# Patient Record
Sex: Male | Born: 1970 | Race: White | Hispanic: No | Marital: Married | Smoking: Former smoker
Health system: Southern US, Community
[De-identification: ages and names within clinical notes are randomized; demographics above are authoritative.]

## PROBLEM LIST (undated history)

## (undated) DIAGNOSIS — G4730 Sleep apnea, unspecified: Secondary | ICD-10-CM

## (undated) DIAGNOSIS — G709 Myoneural disorder, unspecified: Secondary | ICD-10-CM

## (undated) DIAGNOSIS — M1990 Unspecified osteoarthritis, unspecified site: Secondary | ICD-10-CM

## (undated) DIAGNOSIS — C801 Malignant (primary) neoplasm, unspecified: Secondary | ICD-10-CM

## (undated) HISTORY — PX: BACK SURGERY: SHX140

## (undated) HISTORY — PX: LUMBAR LAMINECTOMY: SHX95

---

## 2008-04-03 HISTORY — PX: FRACTURE SURGERY: SHX138

## 2008-06-10 ENCOUNTER — Ambulatory Visit (HOSPITAL_COMMUNITY): Admission: RE | Admit: 2008-06-10 | Discharge: 2008-06-10 | Payer: Self-pay | Admitting: Neurosurgery

## 2010-07-14 LAB — CBC
HCT: 46 % (ref 39.0–52.0)
Hemoglobin: 16.4 g/dL (ref 13.0–17.0)
MCHC: 35.7 g/dL (ref 30.0–36.0)
MCV: 92.9 fL (ref 78.0–100.0)
RDW: 12.3 % (ref 11.5–15.5)

## 2010-08-16 NOTE — Op Note (Signed)
NAME:  DUVALL, COMES NO.:  [id number]   MEDICAL RECORD NO.:  [id number]          PATIENT TYPE:  OIB   LOCATION:  3534                         FACILITY:  [facility]   PHYSICIAN:  Donalee Citrin, M.D.        DATE OF BIRTH:  02/09/1971   DATE OF PROCEDURE:  06/10/2008  DATE OF DISCHARGE:  06/10/2008                               OPERATIVE REPORT   PREOPERATIVE DIAGNOSIS:  Lumbar spondylosis with radiculopathy and  herniated nucleus pulposis at L5-S1 and L4-L5.   PROCEDURE:  Lumbar laminectomy and microdiskectomy L4-5 and L5-S1 with  microscopic dissection of the L4-L5 disk space and microscopic  diskectomy as well as microscopic dissection of the right S1 nerve root  and microdiskectomy at L5-S1.   SURGEON:  Donalee Citrin, MD   ASSISTANT:  Tia Alert, MD   ANESTHESIA:  General endotracheal.   HISTORY OF PRESENT ILLNESS:  The patient is a very pleasant 40 year old  gentleman who has had progressively worsening back and right leg pain  radiating to his hip down the back of his leg, top of foot, and big toe,  as well as  outside of his foot and bottom of is foot with numbness and  tingling in the same distribution.  The patient failed all forms of  conservative treatment, epidural steroid injections, physical therapy,  and time.  MRI scan showed a very large disk herniation at L5-S1 as well  as a large one at central at L4-5.  Due to the patient's failure to  conservative treatment, MRI findings, and physical exam, the patient was  recommended laminectomy and microdiskectomy at both levels.  Risks and  benefits of the operation were explained to the patient.  He understood  and agreed to proceed forth.   DESCRIPTION OF PROCEDURE:  The patient was brought to the OR and induced  under general anesthesia, positioned prone on the Wilson frame.  Back  was prepped and draped in sterile fashion.  Preoperative x-ray localized  at the appropriate level.  After infiltration of 10  mL of lidocaine with  epinephrine,  a midline incision was made.  Bovie electrocautery was  used to take down the subperiosteum and dissection was carried down the  lamina at L4-5 and L5-S1 on the right side.  Intraoperative x-ray  confirmed localization at the appropriate level.  So then using a high-  speed drill, the inferior aspect of L4 medial facet complex and superior  aspect of L5 and inferior aspect of L5 and  medial facet complex,  superior aspect of S1 was drilled down.  First working at L4-5, the  undersurface of the lamina at L4 was bitten away as well as medial facet  and superior aspect of L4-5.  Ligament was identified, removed a piece  of fascia exposed and thecal sac.  At this point, some additional  underbiting of the medial facet complex was carried out, then attention  taken to disk space below this in a similar fashion and the undersurface  of the L5 lamina, medial facet complex, superior aspect of S1 was  removed  and piece of fascia exposed and thecal sac.  At this point, the  operating microscope was draped, brought into the field, and under  microscopic illumination, further underbiting of the medial facet  complex was carried out to identify the S1 nerve root.  The S1 nerve  root was then dissected off.  Very large disk herniation still partially  contained with the ligament at L5-S1.  After this was cut, a very large  free-fragment disk came out en bloc as one piece at L5-S1 decompressing  the thecal sac immediately.  Then disk space was further cleaned out  with Epstein curettes and pituitary rongeurs ensuring decompression of  the thecal sac centrally as well as the S1 nerve root on the right.  At  this point, Gelfoam was placed in the disk space.  Attention was taken  to L4-5, further underbiting of the medial facet complex was carried  out.  Disk space was identified, epidural vein was coagulated, several  large fragments removed in the central compartment of  L4-5.  This  decompressed the thecal sac centrally there as well and the L5 nerve  roots widely patent.  Both laminotomy sites were explored with a  coronary dilator and angled hockey stick and noted to be widely patent.  Wound was copiously irrigated.  Meticulous hemostasis was  maintained__________.  Muscle fascia was reapproximated in layers with  Vicryl and the skin was closed with running 4-0 subcuticular.  Benzoin  and Steri-Strips applied.  The patient went to recovery room in stable  condition.  At the end of the case, all sponge and instrument counts  were correct.           ______________________________  Donalee Citrin, M.D.     GC/MEDQ  D:  06/10/2008  T:  06/11/2008  Job:  [id number]

## 2012-06-18 ENCOUNTER — Other Ambulatory Visit: Payer: Self-pay | Admitting: *Deleted

## 2012-06-18 DIAGNOSIS — G4710 Hypersomnia, unspecified: Secondary | ICD-10-CM

## 2012-06-18 DIAGNOSIS — R0683 Snoring: Secondary | ICD-10-CM

## 2012-06-27 ENCOUNTER — Ambulatory Visit (INDEPENDENT_AMBULATORY_CARE_PROVIDER_SITE_OTHER): Payer: BC Managed Care – PPO | Admitting: Neurology

## 2012-06-27 ENCOUNTER — Other Ambulatory Visit: Payer: Self-pay | Admitting: *Deleted

## 2012-06-27 VITALS — BP 162/89 | Ht 69.5 in | Wt 242.0 lb

## 2012-06-27 DIAGNOSIS — R0683 Snoring: Secondary | ICD-10-CM

## 2012-06-27 DIAGNOSIS — E669 Obesity, unspecified: Secondary | ICD-10-CM

## 2012-06-27 DIAGNOSIS — G4733 Obstructive sleep apnea (adult) (pediatric): Secondary | ICD-10-CM

## 2012-06-27 DIAGNOSIS — G4710 Hypersomnia, unspecified: Secondary | ICD-10-CM

## 2012-06-27 DIAGNOSIS — G4730 Sleep apnea, unspecified: Secondary | ICD-10-CM

## 2012-06-27 DIAGNOSIS — R0609 Other forms of dyspnea: Secondary | ICD-10-CM

## 2012-06-27 DIAGNOSIS — G4737 Central sleep apnea in conditions classified elsewhere: Secondary | ICD-10-CM

## 2012-06-27 DIAGNOSIS — R0989 Other specified symptoms and signs involving the circulatory and respiratory systems: Secondary | ICD-10-CM

## 2012-06-27 NOTE — Addendum Note (Signed)
Addended by: Bonita Quin B on: 06/27/2012 04:14 PM   Modules accepted: Orders

## 2012-07-12 ENCOUNTER — Other Ambulatory Visit: Payer: Self-pay | Admitting: *Deleted

## 2012-07-12 DIAGNOSIS — E669 Obesity, unspecified: Secondary | ICD-10-CM

## 2012-07-12 DIAGNOSIS — R0683 Snoring: Secondary | ICD-10-CM

## 2012-07-12 DIAGNOSIS — G4710 Hypersomnia, unspecified: Secondary | ICD-10-CM

## 2012-07-22 ENCOUNTER — Telehealth: Payer: Self-pay | Admitting: Neurology

## 2012-07-22 NOTE — Telephone Encounter (Signed)
Called the patient to try and scheduled titration study based on recommendations from Dr. Porfirio Mylar Dohmeier.  Pt says that due to a very busy schedule, he would have to call back at a later date to set it up...kl

## 2016-04-03 HISTORY — PX: HEMORRHOID SURGERY: SHX153

## 2018-12-27 ENCOUNTER — Other Ambulatory Visit: Payer: Self-pay | Admitting: Neurosurgery

## 2018-12-27 DIAGNOSIS — M5412 Radiculopathy, cervical region: Secondary | ICD-10-CM

## 2019-01-16 ENCOUNTER — Other Ambulatory Visit: Payer: Self-pay

## 2019-01-27 HISTORY — PX: CERVICAL FUSION: SHX112

## 2019-09-30 ENCOUNTER — Other Ambulatory Visit: Payer: Self-pay | Admitting: Neurosurgery

## 2019-10-21 NOTE — Pre-Procedure Instructions (Signed)
Your procedure is scheduled on Monday, July 26 from 07:30 AM- 11:57 AM.  Report to [facility]tr[facility] "A" at 05:30 A.M., and check in at the Admitting office.  Call this number if you have problems the morning of surgery:  [phone]  Call [phone] if you have any questions prior to your surgery date Monday-Friday 8am-4pm.    Remember:  Do not eat or drink after midnight the night before your surgery.    Take these medicines the morning of surgery with A SIP OF WATER: fexofenadine (ALLEGRA) gabapentin (NEURONTIN)   IF NEEDED: acetaminophen (TYLENOL) cyclobenzaprine (FLEXERIL)  olopatadine (PATADAY) eye drops   As of today, STOP taking any Aspirin (unless otherwise instructed by your surgeon) Aleve, Naproxen, Ibuprofen, Motrin, Advil, Goody's, BC's, all herbal medications, fish oil, and all vitamins.      The Morning of Surgery:                Do not wear jewelry.            Do not wear lotions, powders, colognes, or deodorant.            Men may shave face and neck.            Do not bring valuables to the hospital.            [facility] is not responsible for any belongings or valuables.  Do NOT Smoke (Tobacco/Vaping) or drink Alcohol 24 hours prior to your procedure.  If you use a CPAP at night, you may bring all equipment for your overnight stay.   Contacts, glasses, dentures or bridgework may not be worn into surgery.      For patients admitted to the hospital, discharge time will be determined by your treatment team.   Patients discharged the day of surgery will not be allowed to drive home, and someone needs to stay with them for 24 hours.    Special instructions:   [facility]- Preparing For Surgery  Before surgery, you can play an important role. Because skin is not sterile, your skin needs to be as free of germs as possible. You can reduce the number of germs on your skin by washing with CHG (chlorahexidine gluconate) Soap before surgery.  CHG is  an antiseptic cleaner which kills germs and bonds with the skin to continue killing germs even after washing.    Oral Hygiene is also important to reduce your risk of infection.  Remember - BRUSH YOUR TEETH THE MORNING OF SURGERY WITH YOUR REGULAR TOOTHPASTE  Please do not use if you have an allergy to CHG or antibacterial soaps. If your skin becomes reddened/irritated stop using the CHG.  Do not shave (including legs and underarms) for at least 48 hours prior to first CHG shower. It is OK to shave your face.  Please follow these instructions carefully.   1. Shower the NIGHT BEFORE SURGERY and the MORNING OF SURGERY with CHG Soap.   2. If you chose to wash your hair, wash your hair first as usual with your normal shampoo.  3. After you shampoo, rinse your hair and body thoroughly to remove the shampoo.  4. Use CHG as you would any other liquid soap. You can apply CHG directly to the skin and wash gently with a scrungie or a clean washcloth.   5. Apply the CHG Soap to your body ONLY FROM THE NECK DOWN.  Do not use on open wounds or open sores. Avoid contact  with your eyes, ears, mouth and genitals (private parts). Wash Face and genitals (private parts)  with your normal soap.   6. Wash thoroughly, paying special attention to the area where your surgery will be performed.  7. Thoroughly rinse your body with warm water from the neck down.  8. DO NOT shower/wash with your normal soap after using and rinsing off the CHG Soap.  9. Pat yourself dry with a CLEAN TOWEL.  10. Wear CLEAN PAJAMAS to bed the night before surgery  11. Place CLEAN SHEETS on your bed the night of your first shower and DO NOT SLEEP WITH PETS.   Day of Surgery: Wear Clean/Comfortable clothing the morning of surgery Do not apply any deodorants/lotions.   Remember to brush your teeth WITH YOUR REGULAR TOOTHPASTE.   Please read over the following fact sheets that you were given.

## 2019-10-22 ENCOUNTER — Encounter (HOSPITAL_COMMUNITY): Payer: Self-pay

## 2019-10-22 ENCOUNTER — Other Ambulatory Visit: Payer: Self-pay

## 2019-10-22 ENCOUNTER — Encounter (HOSPITAL_COMMUNITY)
Admission: RE | Admit: 2019-10-22 | Discharge: 2019-10-22 | Disposition: A | Discharge status: Home / Self Care | Payer: BC Managed Care – PPO | Source: Ambulatory Visit | Attending: Neurosurgery | Admitting: Neurosurgery

## 2019-10-22 DIAGNOSIS — Z01812 Encounter for preprocedural laboratory examination: Secondary | ICD-10-CM | POA: Insufficient documentation

## 2019-10-22 HISTORY — DX: Malignant (primary) neoplasm, unspecified: C80.1

## 2019-10-22 HISTORY — DX: Sleep apnea, unspecified: G47.30

## 2019-10-22 HISTORY — DX: Unspecified osteoarthritis, unspecified site: M19.90

## 2019-10-22 HISTORY — DX: Myoneural disorder, unspecified: G70.9

## 2019-10-22 LAB — CBC
HCT: 45.1 % (ref 39.0–52.0)
Hemoglobin: 16 g/dL (ref 13.0–17.0)
MCH: 34.2 pg — ABNORMAL HIGH (ref 26.0–34.0)
MCHC: 35.5 g/dL (ref 30.0–36.0)
MCV: 96.4 fL (ref 80.0–100.0)
Platelets: 166 10*3/uL (ref 150–400)
RBC: 4.68 MIL/uL (ref 4.22–5.81)
RDW: 12.3 % (ref 11.5–15.5)
WBC: 4.1 10*3/uL (ref 4.0–10.5)
nRBC: 0 % (ref 0.0–0.2)

## 2019-10-22 LAB — SURGICAL PCR SCREEN
MRSA, PCR: NEGATIVE
Staphylococcus aureus: NEGATIVE

## 2019-10-22 LAB — TYPE AND SCREEN
ABO/RH(D): O POS
Antibody Screen: NEGATIVE

## 2019-10-22 LAB — COMPREHENSIVE METABOLIC PANEL
ALT: 42 U/L (ref 0–44)
AST: 30 U/L (ref 15–41)
Albumin: 4.4 g/dL (ref 3.5–5.0)
Alkaline Phosphatase: 60 U/L (ref 38–126)
Anion gap: 9 (ref 5–15)
BUN: 15 mg/dL (ref 6–20)
CO2: 25 mmol/L (ref 22–32)
Calcium: 9.3 mg/dL (ref 8.9–10.3)
Chloride: 103 mmol/L (ref 98–111)
Creatinine, Ser: 1.09 mg/dL (ref 0.61–1.24)
GFR calc Af Amer: >60 mL/min (ref >60)
GFR calc non Af Amer: >60 mL/min (ref >60)
Glucose, Bld: 118 mg/dL — ABNORMAL HIGH (ref 70–99)
Potassium: 4 mmol/L (ref 3.5–5.1)
Sodium: 137 mmol/L (ref 135–145)
Total Bilirubin: 1.1 mg/dL (ref 0.3–1.2)
Total Protein: 7.9 g/dL (ref 6.5–8.1)

## 2019-10-22 NOTE — Progress Notes (Signed)
PCP - Roni Bread, MD along with Devona Konig, PA-C Cardiologist - Denies  PPM/ICD - Denies  Chest x-ray - 11/18/18 C.E EKG - 11/15/18 C.E Stress Test - Per patient > 10 years ago at Dr. Thompson Caul office. Per patient, results were negative; no need to follow up with cardiologist. ECHO - 11/15/18 C.E Cardiac Cath - Denies  Sleep Study - Yes, positive for OSA CPAP - Yes  Patient denies being diabetic.  Blood Thinner Instructions: N/A Aspirin Instructions: N/A  ERAS Protcol - No PRE-SURGERY Ensure or G2- N/A  COVID TEST- 10/24/19   Anesthesia review: Yes, review ECHO.  Patient denies shortness of breath, fever, cough and chest pain at PAT appointment   All instructions explained to the patient, with a verbal understanding of the material. Patient agrees to go over the instructions while at home for a better understanding. Patient also instructed to self quarantine after being tested for COVID-19. The opportunity to ask questions was provided.

## 2019-10-23 NOTE — Progress Notes (Signed)
Anesthesia Chart Review:  Last seen by PCP Devona Konig, PA-C 10/14/2019, doing well at that visit, discussed upcoming lumbar surgery.  OSA on CPAP.  Preop labs reviewed, unremarkable.  EKG 11/15/2018 (Care Everywhere, tracing requested): Normal sinus rhythm.  Rate 75.  TTE 11/15/18: Interpretation Summary  A complete two-dimensional transthoracic echocardiogram with color flow  Doppler and spectral Doppler was performed. The left ventricle is normal in  size.  There is normal left ventricular wall thickness.  The left ventricular wall motion is normal.  There is mild (1+) mitral regurgitation.  The aortic valve is not well visualized, but is grossly normal.  The left ventricular ejection fraction is normal (55-60%).  The left ventricular diastolic function is normal.     Wynonia Musty [facility] Stay [facility]/Anesthesiology Phone [phone] 10/23/2019 1:02 PM

## 2019-10-23 NOTE — Anesthesia Preprocedure Evaluation (Addendum)
Anesthesia Evaluation  Patient identified by MRN, date of birth, ID band Patient awake    Reviewed: Allergy & Precautions, NPO status , Patient's Chart, lab work & pertinent test results  Airway Mallampati: II  TM Distance: >3 FB     Dental   Pulmonary sleep apnea , former smoker,    breath sounds clear to auscultation       Cardiovascular negative cardio ROS   Rhythm:Regular Rate:Normal     Neuro/Psych  Neuromuscular disease    GI/Hepatic negative GI ROS, Neg liver ROS,   Endo/Other  negative endocrine ROS  Renal/GU negative Renal ROS     Musculoskeletal  (+) Arthritis ,   Abdominal   Peds  Hematology   Anesthesia Other Findings   Reproductive/Obstetrics                           Anesthesia Physical Anesthesia Plan  ASA: II  Anesthesia Plan: General   Post-op Pain Management:    Induction: Intravenous  PONV Risk Score and Plan: 2 and Ondansetron, Dexamethasone and Midazolam  Airway Management Planned: Oral ETT  Additional Equipment:   Intra-op Plan:   Post-operative Plan: Possible Post-op intubation/ventilation  Informed Consent: I have reviewed the patients History and Physical, chart, labs and discussed the procedure including the risks, benefits and alternatives for the proposed anesthesia with the patient or authorized representative who has indicated his/her understanding and acceptance.     Dental advisory given  Plan Discussed with:   Anesthesia Plan Comments: (PAT note by Karoline Caldwell, PA-C: Last seen by PCP Devona Konig, PA-C 10/14/2019, doing well at that visit, discussed upcoming lumbar surgery.  OSA on CPAP.  Preop labs reviewed, unremarkable.  EKG 11/15/2018 (Care Everywhere, tracing requested): Normal sinus rhythm.  Rate 75.  TTE 11/15/18: Interpretation Summary  A complete two-dimensional transthoracic echocardiogram with color flow  Doppler and  spectral Doppler was performed. The left ventricle is normal in  size.  There is normal left ventricular wall thickness.  The left ventricular wall motion is normal.  There is mild (1+) mitral regurgitation.  The aortic valve is not well visualized, but is grossly normal.  The left ventricular ejection fraction is normal (55-60%).  The left ventricular diastolic function is normal.   )      Anesthesia Quick Evaluation

## 2019-10-24 ENCOUNTER — Other Ambulatory Visit (HOSPITAL_COMMUNITY)
Admission: RE | Admit: 2019-10-24 | Discharge: 2019-10-24 | Disposition: A | Discharge status: Home / Self Care | Payer: BC Managed Care – PPO | Source: Ambulatory Visit | Attending: Neurosurgery | Admitting: Neurosurgery

## 2019-10-24 DIAGNOSIS — Z01812 Encounter for preprocedural laboratory examination: Secondary | ICD-10-CM | POA: Insufficient documentation

## 2019-10-24 DIAGNOSIS — Z20822 Contact with and (suspected) exposure to covid-19: Secondary | ICD-10-CM | POA: Insufficient documentation

## 2019-10-24 LAB — SARS CORONAVIRUS 2 (TAT 6-24 HRS): SARS Coronavirus 2: NEGATIVE

## 2019-10-27 ENCOUNTER — Inpatient Hospital Stay (HOSPITAL_COMMUNITY): Payer: BC Managed Care – PPO | Admitting: Anesthesiology

## 2019-10-27 ENCOUNTER — Other Ambulatory Visit: Payer: Self-pay

## 2019-10-27 ENCOUNTER — Encounter (HOSPITAL_COMMUNITY): Payer: Self-pay | Admitting: Neurosurgery

## 2019-10-27 ENCOUNTER — Inpatient Hospital Stay (HOSPITAL_COMMUNITY): Payer: BC Managed Care – PPO | Admitting: Physician Assistant

## 2019-10-27 ENCOUNTER — Inpatient Hospital Stay (HOSPITAL_COMMUNITY)
Admission: RE | Admit: 2019-10-27 | Discharge: 2019-10-28 | DRG: 460 | Disposition: A | Discharge status: Home / Self Care | Payer: BC Managed Care – PPO | Attending: Neurosurgery | Admitting: Neurosurgery

## 2019-10-27 ENCOUNTER — Inpatient Hospital Stay (HOSPITAL_COMMUNITY): Payer: BC Managed Care – PPO

## 2019-10-27 ENCOUNTER — Encounter (HOSPITAL_COMMUNITY)
Admission: RE | Disposition: A | Discharge status: Home / Self Care | Payer: Self-pay | Source: Home / Self Care | Attending: Neurosurgery

## 2019-10-27 DIAGNOSIS — Z87891 Personal history of nicotine dependence: Secondary | ICD-10-CM

## 2019-10-27 DIAGNOSIS — M5136 Other intervertebral disc degeneration, lumbar region: Secondary | ICD-10-CM | POA: Diagnosis present

## 2019-10-27 DIAGNOSIS — M48061 Spinal stenosis, lumbar region without neurogenic claudication: Secondary | ICD-10-CM | POA: Diagnosis present

## 2019-10-27 DIAGNOSIS — M5137 Other intervertebral disc degeneration, lumbosacral region: Secondary | ICD-10-CM | POA: Diagnosis present

## 2019-10-27 DIAGNOSIS — M4807 Spinal stenosis, lumbosacral region: Secondary | ICD-10-CM | POA: Diagnosis present

## 2019-10-27 DIAGNOSIS — Z20822 Contact with and (suspected) exposure to covid-19: Secondary | ICD-10-CM | POA: Diagnosis present

## 2019-10-27 DIAGNOSIS — M47816 Spondylosis without myelopathy or radiculopathy, lumbar region: Secondary | ICD-10-CM | POA: Diagnosis present

## 2019-10-27 DIAGNOSIS — Z79899 Other long term (current) drug therapy: Secondary | ICD-10-CM

## 2019-10-27 DIAGNOSIS — Z8547 Personal history of malignant neoplasm of testis: Secondary | ICD-10-CM | POA: Diagnosis not present

## 2019-10-27 DIAGNOSIS — Z419 Encounter for procedure for purposes other than remedying health state, unspecified: Secondary | ICD-10-CM

## 2019-10-27 LAB — ABO/RH: ABO/RH(D): O POS

## 2019-10-27 SURGERY — POSTERIOR LUMBAR FUSION 2 LEVEL
Anesthesia: General | Site: Back

## 2019-10-27 MED ORDER — ONDANSETRON HCL 4 MG/2ML IJ SOLN
INTRAMUSCULAR | Status: DC | PRN
  Administered 2019-10-27: 4 mg via INTRAVENOUS

## 2019-10-27 MED ORDER — FENTANYL CITRATE (PF) 100 MCG/2ML IJ SOLN
INTRAMUSCULAR | Status: DC | PRN
  Administered 2019-10-27: 100 ug via INTRAVENOUS
  Administered 2019-10-27 (×3): 50 ug via INTRAVENOUS
  Administered 2019-10-27: 100 ug via INTRAVENOUS

## 2019-10-27 MED ORDER — HYDROMORPHONE HCL 1 MG/ML IJ SOLN
0.5000 mg | INTRAMUSCULAR | Status: DC | PRN
  Administered 2019-10-27: 0.5 mg via INTRAVENOUS
  Filled 2019-10-27: qty 0.5

## 2019-10-27 MED ORDER — SODIUM CHLORIDE 0.9% FLUSH: 3.0000 mL | INTRAVENOUS | Status: DC | PRN

## 2019-10-27 MED ORDER — ORAL CARE MOUTH RINSE: 15.0000 mL | Freq: Once | OROMUCOSAL | Status: AC

## 2019-10-27 MED ORDER — ONDANSETRON HCL 4 MG/2ML IJ SOLN: 4.0000 mg | Freq: Four times a day (QID) | INTRAMUSCULAR | Status: DC | PRN

## 2019-10-27 MED ORDER — LIDOCAINE-EPINEPHRINE 1 %-1:100000 IJ SOLN
INTRAMUSCULAR | Status: AC
  Filled 2019-10-27: qty 1

## 2019-10-27 MED ORDER — ROCURONIUM BROMIDE 10 MG/ML (PF) SYRINGE
PREFILLED_SYRINGE | INTRAVENOUS | Status: AC
  Filled 2019-10-27: qty 10

## 2019-10-27 MED ORDER — MENTHOL 3 MG MT LOZG: 1.0000 | LOZENGE | OROMUCOSAL | Status: DC | PRN

## 2019-10-27 MED ORDER — ZOLPIDEM TARTRATE 5 MG PO TABS
5.0000 mg | ORAL_TABLET | Freq: Every day | ORAL | Status: DC
  Administered 2019-10-27: 5 mg via ORAL
  Filled 2019-10-27: qty 1

## 2019-10-27 MED ORDER — CEFAZOLIN SODIUM-DEXTROSE 2-4 GM/100ML-% IV SOLN
2.0000 g | Freq: Three times a day (TID) | INTRAVENOUS | Status: DC
  Administered 2019-10-27 – 2019-10-28 (×2): 2 g via INTRAVENOUS
  Filled 2019-10-27 (×2): qty 100

## 2019-10-27 MED ORDER — SODIUM CHLORIDE 0.9 % IV SOLN: 250.0000 mL | INTRAVENOUS | Status: DC

## 2019-10-27 MED ORDER — LORATADINE 10 MG PO TABS
10.0000 mg | ORAL_TABLET | Freq: Every day | ORAL | Status: DC
  Administered 2019-10-27 – 2019-10-28 (×2): 10 mg via ORAL
  Filled 2019-10-27 (×2): qty 1

## 2019-10-27 MED ORDER — ONDANSETRON HCL 4 MG PO TABS: 4.0000 mg | ORAL_TABLET | Freq: Four times a day (QID) | ORAL | Status: DC | PRN

## 2019-10-27 MED ORDER — ASCORBIC ACID 500 MG PO TABS
1000.0000 mg | ORAL_TABLET | Freq: Every day | ORAL | Status: DC
  Administered 2019-10-27 – 2019-10-28 (×2): 1000 mg via ORAL
  Filled 2019-10-27 (×2): qty 2

## 2019-10-27 MED ORDER — DEXMEDETOMIDINE (PRECEDEX) IN NS 20 MCG/5ML (4 MCG/ML) IV SYRINGE
PREFILLED_SYRINGE | INTRAVENOUS | Status: AC
  Filled 2019-10-27: qty 5

## 2019-10-27 MED ORDER — DEXMEDETOMIDINE HCL 200 MCG/2ML IV SOLN
INTRAVENOUS | Status: DC | PRN
  Administered 2019-10-27: 12 ug via INTRAVENOUS
  Administered 2019-10-27: 8 ug via INTRAVENOUS

## 2019-10-27 MED ORDER — 0.9 % SODIUM CHLORIDE (POUR BTL) OPTIME
TOPICAL | Status: DC | PRN
  Administered 2019-10-27: 1000 mL

## 2019-10-27 MED ORDER — BUPIVACAINE LIPOSOME 1.3 % IJ SUSP
20.0000 mL | Freq: Once | INTRAMUSCULAR | Status: DC
  Filled 2019-10-27: qty 20

## 2019-10-27 MED ORDER — PHENOL 1.4 % MT LIQD: 1.0000 | OROMUCOSAL | Status: DC | PRN

## 2019-10-27 MED ORDER — CYCLOBENZAPRINE HCL 10 MG PO TABS
10.0000 mg | ORAL_TABLET | Freq: Three times a day (TID) | ORAL | Status: DC | PRN
  Administered 2019-10-27 (×2): 10 mg via ORAL
  Filled 2019-10-27 (×2): qty 1

## 2019-10-27 MED ORDER — LIDOCAINE 2% (20 MG/ML) 5 ML SYRINGE
INTRAMUSCULAR | Status: DC | PRN
  Administered 2019-10-27: 100 mg via INTRAVENOUS

## 2019-10-27 MED ORDER — ALUM & MAG HYDROXIDE-SIMETH 200-200-20 MG/5ML PO SUSP: 30.0000 mL | Freq: Four times a day (QID) | ORAL | Status: DC | PRN

## 2019-10-27 MED ORDER — ACETAMINOPHEN 325 MG PO TABS
650.0000 mg | ORAL_TABLET | ORAL | Status: DC | PRN
  Administered 2019-10-27 – 2019-10-28 (×2): 650 mg via ORAL
  Filled 2019-10-27 (×2): qty 2

## 2019-10-27 MED ORDER — THROMBIN 20000 UNITS EX SOLR
CUTANEOUS | Status: DC | PRN
  Administered 2019-10-27: 20 mL via TOPICAL

## 2019-10-27 MED ORDER — PROPOFOL 10 MG/ML IV BOLUS
INTRAVENOUS | Status: DC | PRN
  Administered 2019-10-27: 50 mg via INTRAVENOUS
  Administered 2019-10-27: 200 mg via INTRAVENOUS

## 2019-10-27 MED ORDER — ROCURONIUM BROMIDE 10 MG/ML (PF) SYRINGE
PREFILLED_SYRINGE | INTRAVENOUS | Status: DC | PRN
  Administered 2019-10-27: 30 mg via INTRAVENOUS
  Administered 2019-10-27: 40 mg via INTRAVENOUS
  Administered 2019-10-27: 60 mg via INTRAVENOUS

## 2019-10-27 MED ORDER — LORATADINE 10 MG PO TABS: 10.0000 mg | ORAL_TABLET | Freq: Every day | ORAL | Status: DC

## 2019-10-27 MED ORDER — LACTATED RINGERS IV SOLN: INTRAVENOUS | Status: DC

## 2019-10-27 MED ORDER — DEXAMETHASONE SODIUM PHOSPHATE 4 MG/ML IJ SOLN
INTRAMUSCULAR | Status: DC | PRN
  Administered 2019-10-27: 10 mg via INTRAVENOUS

## 2019-10-27 MED ORDER — ONDANSETRON HCL 4 MG/2ML IJ SOLN
INTRAMUSCULAR | Status: AC
  Filled 2019-10-27: qty 2

## 2019-10-27 MED ORDER — GABAPENTIN 600 MG PO TABS
600.0000 mg | ORAL_TABLET | Freq: Two times a day (BID) | ORAL | Status: DC
  Administered 2019-10-27 – 2019-10-28 (×2): 600 mg via ORAL
  Filled 2019-10-27 (×2): qty 1

## 2019-10-27 MED ORDER — FENTANYL CITRATE (PF) 100 MCG/2ML IJ SOLN
INTRAMUSCULAR | Status: AC
  Filled 2019-10-27: qty 2

## 2019-10-27 MED ORDER — SUGAMMADEX SODIUM 200 MG/2ML IV SOLN
INTRAVENOUS | Status: DC | PRN
  Administered 2019-10-27: 200 mg via INTRAVENOUS

## 2019-10-27 MED ORDER — ACETAMINOPHEN 650 MG RE SUPP: 650.0000 mg | RECTAL | Status: DC | PRN

## 2019-10-27 MED ORDER — MIDAZOLAM HCL 5 MG/5ML IJ SOLN
INTRAMUSCULAR | Status: DC | PRN
  Administered 2019-10-27: 2 mg via INTRAVENOUS

## 2019-10-27 MED ORDER — CEFAZOLIN SODIUM-DEXTROSE 2-4 GM/100ML-% IV SOLN
2.0000 g | INTRAVENOUS | Status: AC
  Administered 2019-10-27 (×2): 2 g via INTRAVENOUS
  Filled 2019-10-27: qty 100

## 2019-10-27 MED ORDER — FENTANYL CITRATE (PF) 100 MCG/2ML IJ SOLN
25.0000 ug | INTRAMUSCULAR | Status: DC | PRN
  Administered 2019-10-27 (×3): 50 ug via INTRAVENOUS

## 2019-10-27 MED ORDER — LIDOCAINE-EPINEPHRINE 1 %-1:100000 IJ SOLN
INTRAMUSCULAR | Status: DC | PRN
  Administered 2019-10-27: 10 mL

## 2019-10-27 MED ORDER — VITAMIN D (ERGOCALCIFEROL) 1.25 MG (50000 UNIT) PO CAPS: 50000.0000 [IU] | ORAL_CAPSULE | ORAL | Status: DC

## 2019-10-27 MED ORDER — CEFAZOLIN SODIUM 1 G IJ SOLR
INTRAMUSCULAR | Status: AC
  Filled 2019-10-27: qty 20

## 2019-10-27 MED ORDER — CYANOCOBALAMIN 500 MCG PO TABS
500.0000 ug | ORAL_TABLET | Freq: Every day | ORAL | Status: DC
  Administered 2019-10-28: 500 ug via ORAL
  Filled 2019-10-27: qty 1

## 2019-10-27 MED ORDER — MIDAZOLAM HCL 2 MG/2ML IJ SOLN
INTRAMUSCULAR | Status: AC
  Filled 2019-10-27: qty 2

## 2019-10-27 MED ORDER — ACETAMINOPHEN 500 MG PO TABS: 1000.0000 mg | ORAL_TABLET | Freq: Four times a day (QID) | ORAL | Status: DC | PRN

## 2019-10-27 MED ORDER — THROMBIN 20000 UNITS EX SOLR
CUTANEOUS | Status: AC
  Filled 2019-10-27: qty 20000

## 2019-10-27 MED ORDER — OLOPATADINE HCL 0.1 % OP SOLN
1.0000 [drp] | Freq: Two times a day (BID) | OPHTHALMIC | Status: DC | PRN
  Filled 2019-10-27: qty 5

## 2019-10-27 MED ORDER — LIDOCAINE 2% (20 MG/ML) 5 ML SYRINGE
INTRAMUSCULAR | Status: AC
  Filled 2019-10-27: qty 5

## 2019-10-27 MED ORDER — SODIUM CHLORIDE 0.9% FLUSH: 3.0000 mL | Freq: Two times a day (BID) | INTRAVENOUS | Status: DC

## 2019-10-27 MED ORDER — PANTOPRAZOLE SODIUM 40 MG IV SOLR
40.0000 mg | Freq: Every day | INTRAVENOUS | Status: DC
  Filled 2019-10-27: qty 40

## 2019-10-27 MED ORDER — DEXAMETHASONE SODIUM PHOSPHATE 10 MG/ML IJ SOLN
INTRAMUSCULAR | Status: AC
  Filled 2019-10-27: qty 1

## 2019-10-27 MED ORDER — CYCLOBENZAPRINE HCL 10 MG PO TABS
10.0000 mg | ORAL_TABLET | Freq: Three times a day (TID) | ORAL | Status: DC | PRN
Start: 2019-10-27 — End: 2019-10-27

## 2019-10-27 MED ORDER — SODIUM CHLORIDE 0.9 % IV SOLN
INTRAVENOUS | Status: DC | PRN
  Administered 2019-10-27: 500 mL

## 2019-10-27 MED ORDER — FENTANYL CITRATE (PF) 250 MCG/5ML IJ SOLN
INTRAMUSCULAR | Status: AC
  Filled 2019-10-27: qty 5

## 2019-10-27 MED ORDER — DEXAMETHASONE SODIUM PHOSPHATE 10 MG/ML IJ SOLN
10.0000 mg | Freq: Once | INTRAMUSCULAR | Status: DC
  Filled 2019-10-27: qty 1

## 2019-10-27 MED ORDER — DIPHENHYDRAMINE HCL 25 MG PO CAPS: 50.0000 mg | ORAL_CAPSULE | Freq: Every evening | ORAL | Status: DC | PRN

## 2019-10-27 MED ORDER — BUPIVACAINE LIPOSOME 1.3 % IJ SUSP
INTRAMUSCULAR | Status: DC | PRN
  Administered 2019-10-27: 20 mL

## 2019-10-27 MED ORDER — PROPOFOL 10 MG/ML IV BOLUS
INTRAVENOUS | Status: AC
  Filled 2019-10-27: qty 40

## 2019-10-27 MED ORDER — CHLORHEXIDINE GLUCONATE CLOTH 2 % EX PADS: 6.0000 | MEDICATED_PAD | Freq: Once | CUTANEOUS | Status: DC

## 2019-10-27 MED ORDER — CHLORHEXIDINE GLUCONATE 0.12 % MT SOLN
15.0000 mL | Freq: Once | OROMUCOSAL | Status: AC
  Administered 2019-10-27: 15 mL via OROMUCOSAL
  Filled 2019-10-27: qty 15

## 2019-10-27 MED ORDER — PANTOPRAZOLE SODIUM 40 MG PO TBEC
40.0000 mg | DELAYED_RELEASE_TABLET | Freq: Every day | ORAL | Status: DC
  Administered 2019-10-27 – 2019-10-28 (×2): 40 mg via ORAL
  Filled 2019-10-27: qty 1

## 2019-10-27 MED ORDER — CIPROFLOXACIN HCL 500 MG PO TABS
500.0000 mg | ORAL_TABLET | Freq: Two times a day (BID) | ORAL | Status: DC
  Administered 2019-10-28: 500 mg via ORAL
  Filled 2019-10-27 (×2): qty 1

## 2019-10-27 MED ORDER — OXYCODONE HCL 5 MG PO TABS
10.0000 mg | ORAL_TABLET | ORAL | Status: DC | PRN
  Administered 2019-10-27 – 2019-10-28 (×5): 10 mg via ORAL
  Filled 2019-10-27 (×5): qty 2

## 2019-10-27 SURGICAL SUPPLY — 75 items
BAG DECANTER FOR FLEXI CONT (MISCELLANEOUS) ×2 IMPLANT
BENZOIN TINCTURE PRP APPL 2/3 (GAUZE/BANDAGES/DRESSINGS) ×2 IMPLANT
BLADE CLIPPER SURG (BLADE) IMPLANT
BLADE SURG 11 STRL SS (BLADE) ×2 IMPLANT
BONE VIVIGEN FORMABLE 5.4CC (Bone Implant) ×2 IMPLANT
BUR CUTTER 7.0 ROUND (BURR) ×2 IMPLANT
BUR MATCHSTICK NEURO 3.0 LAGG (BURR) ×2 IMPLANT
CANISTER SUCT 3000ML PPV (MISCELLANEOUS) ×2 IMPLANT
CAP LOCKING (Cap) ×6 IMPLANT
CAP LOCKING 5.5 CREO (Cap) ×6 IMPLANT
CARTRIDGE OIL MAESTRO DRILL (MISCELLANEOUS) ×1 IMPLANT
CNTNR URN SCR LID CUP LEK RST (MISCELLANEOUS) ×1 IMPLANT
CONT SPEC 4OZ STRL OR WHT (MISCELLANEOUS) ×1
COVER BACK TABLE 60X90IN (DRAPES) ×2 IMPLANT
COVER WAND RF STERILE (DRAPES) ×2 IMPLANT
DECANTER SPIKE VIAL GLASS SM (MISCELLANEOUS) ×2 IMPLANT
DERMABOND ADVANCED (GAUZE/BANDAGES/DRESSINGS) ×1
DERMABOND ADVANCED .7 DNX12 (GAUZE/BANDAGES/DRESSINGS) ×1 IMPLANT
DIFFUSER DRILL AIR PNEUMATIC (MISCELLANEOUS) ×2 IMPLANT
DRAPE C-ARM 42X72 X-RAY (DRAPES) ×2 IMPLANT
DRAPE C-ARMOR (DRAPES) IMPLANT
DRAPE HALF SHEET 40X57 (DRAPES) IMPLANT
DRAPE LAPAROTOMY 100X72X124 (DRAPES) ×2 IMPLANT
DRAPE SURG 17X23 STRL (DRAPES) ×2 IMPLANT
DRSG OPSITE 4X5.5 SM (GAUZE/BANDAGES/DRESSINGS) ×2 IMPLANT
DRSG OPSITE POSTOP 4X6 (GAUZE/BANDAGES/DRESSINGS) ×2 IMPLANT
DRSG OPSITE POSTOP 4X8 (GAUZE/BANDAGES/DRESSINGS) ×2 IMPLANT
DURAPREP 26ML APPLICATOR (WOUND CARE) ×2 IMPLANT
ELECT REM PT RETURN 9FT ADLT (ELECTROSURGICAL) ×2
ELECTRODE REM PT RTRN 9FT ADLT (ELECTROSURGICAL) ×1 IMPLANT
EVACUATOR 1/8 PVC DRAIN (DRAIN) ×2 IMPLANT
EVACUATOR 3/16  PVC DRAIN (DRAIN) ×1
EVACUATOR 3/16 PVC DRAIN (DRAIN) ×1 IMPLANT
GAUZE 4X4 16PLY RFD (DISPOSABLE) IMPLANT
GAUZE SPONGE 4X4 12PLY STRL (GAUZE/BANDAGES/DRESSINGS) ×2 IMPLANT
GLOVE BIO SURGEON STRL SZ7 (GLOVE) IMPLANT
GLOVE BIO SURGEON STRL SZ8 (GLOVE) ×4 IMPLANT
GLOVE BIOGEL PI IND STRL 7.0 (GLOVE) IMPLANT
GLOVE BIOGEL PI INDICATOR 7.0 (GLOVE)
GLOVE EXAM NITRILE XL STR (GLOVE) IMPLANT
GLOVE INDICATOR 8.5 STRL (GLOVE) ×4 IMPLANT
GOWN STRL REUS W/ TWL LRG LVL3 (GOWN DISPOSABLE) IMPLANT
GOWN STRL REUS W/ TWL XL LVL3 (GOWN DISPOSABLE) ×2 IMPLANT
GOWN STRL REUS W/TWL 2XL LVL3 (GOWN DISPOSABLE) IMPLANT
GOWN STRL REUS W/TWL LRG LVL3 (GOWN DISPOSABLE)
GOWN STRL REUS W/TWL XL LVL3 (GOWN DISPOSABLE) ×2
HEMOSTAT POWDER KIT SURGIFOAM (HEMOSTASIS) IMPLANT
KIT BASIN OR (CUSTOM PROCEDURE TRAY) ×2 IMPLANT
KIT POSITION SURG JACKSON T1 (MISCELLANEOUS) ×2 IMPLANT
KIT TURNOVER KIT B (KITS) ×2 IMPLANT
MILL MEDIUM DISP (BLADE) ×2 IMPLANT
NEEDLE HYPO 21X1.5 SAFETY (NEEDLE) ×2 IMPLANT
NEEDLE HYPO 25X1 1.5 SAFETY (NEEDLE) ×2 IMPLANT
NS IRRIG 1000ML POUR BTL (IV SOLUTION) ×2 IMPLANT
OIL CARTRIDGE MAESTRO DRILL (MISCELLANEOUS) ×2
PACK LAMINECTOMY NEURO (CUSTOM PROCEDURE TRAY) ×2 IMPLANT
PAD ARMBOARD 7.5X6 YLW CONV (MISCELLANEOUS) ×6 IMPLANT
ROD CREO 60MM (Rod) ×4 IMPLANT
SCREW 6.5X5.5 30MM (Screw) ×4 IMPLANT
SHAFT CREO 30MM (Neuro Prosthesis/Implant) ×8 IMPLANT
SPACER SUSTAIN RT 12 8D 9X26 (Spacer) ×4 IMPLANT
SPACER TITANIUM 8X26 11 13 (Spacer) ×4 IMPLANT
SPONGE LAP 4X18 RFD (DISPOSABLE) IMPLANT
SPONGE SURGIFOAM ABS GEL 100 (HEMOSTASIS) ×2 IMPLANT
STRIP CLOSURE SKIN 1/2X4 (GAUZE/BANDAGES/DRESSINGS) ×4 IMPLANT
SUT VIC AB 0 CT1 18XCR BRD8 (SUTURE) ×1 IMPLANT
SUT VIC AB 0 CT1 8-18 (SUTURE) ×1
SUT VIC AB 2-0 CT1 18 (SUTURE) ×2 IMPLANT
SUT VIC AB 4-0 PS2 27 (SUTURE) ×2 IMPLANT
SYR 20ML LL LF (SYRINGE) ×2 IMPLANT
TOWEL GREEN STERILE (TOWEL DISPOSABLE) ×2 IMPLANT
TOWEL GREEN STERILE FF (TOWEL DISPOSABLE) ×2 IMPLANT
TRAY FOLEY MTR SLVR 16FR STAT (SET/KITS/TRAYS/PACK) ×2 IMPLANT
TULIP CREP AMP 5.5MM (Orthopedic Implant) ×12 IMPLANT
WATER STERILE IRR 1000ML POUR (IV SOLUTION) ×2 IMPLANT

## 2019-10-27 NOTE — Anesthesia Postprocedure Evaluation (Signed)
Anesthesia Post Note  Patient: Chad Cook  Procedure(s) Performed: Posterior Lumbar Interbody Fusion - Lumbar Four- Lumbar Five - Lumbar Five-Sacral One - Interbody Fusion (N/A Back)     Patient location during evaluation: PACU Anesthesia Type: General Level of consciousness: awake Pain management: pain level controlled Vital Signs Assessment: post-procedure vital signs reviewed and stable Respiratory status: spontaneous breathing Cardiovascular status: stable Postop Assessment: no apparent nausea or vomiting Anesthetic complications: no   No complications documented.  Last Vitals:  Vitals:   10/27/19 1339 10/27/19 1601  BP: (!) 144/87 (!) 157/75  Pulse: 91 84  Resp: 20 19  Temp: 37.2 C 36.9 C  SpO2: 100% 98%    Last Pain:  Vitals:   10/27/19 1601  TempSrc: Oral  PainSc:                  Rubyann Lingle

## 2019-10-27 NOTE — Transfer of Care (Signed)
Immediate Anesthesia Transfer of Care Note  Patient: Chad Cook  Procedure(s) Performed: Posterior Lumbar Interbody Fusion - Lumbar Four- Lumbar Five - Lumbar Five-Sacral One - Interbody Fusion (N/A Back)  Patient Location: PACU  Anesthesia Type:General  Level of Consciousness: awake  Airway & Oxygen Therapy: Patient Spontanous Breathing and Patient connected to face mask oxygen  Post-op Assessment: Report given to RN and Post -op Vital signs reviewed and stable  Post vital signs: Reviewed and stable  Last Vitals:  Vitals Value Taken Time  BP 146/81 10/27/19 1215  Temp    Pulse 86 10/27/19 1216  Resp 13 10/27/19 1216  SpO2 99 % 10/27/19 1216  Vitals shown include unvalidated device data.  Last Pain:  Vitals:   10/27/19 0632  TempSrc:   PainSc: 6       Patients Stated Pain Goal: 3 (44/58/48 3507)  Complications: No complications documented.

## 2019-10-27 NOTE — Op Note (Signed)
Preoperative diagnosis: Lumbar spinal stenosis degenerative disc disease severe foraminal stenosis L4-5 L5-S1 with possible recurrent disc radiation  Postoperative diagnosis: Same  Procedure: #1 redo decompressive lumbar laminectomies L4-5 and L5-S1 in excess and requiring more work than would be needed with a standard interbody fusion with complete medial facetectomies and radical foraminotomies of the L4-L5 and S1 nerve roots  2.  Posterior lumbar interbody fusion L4-5 L5-S1 utilizing the globus insert and rotate titanium cages packed with locally harvested autograft mixed with vivigen  3.  Cortical screw fixation L4-S1 utilizing the globus Creo modular cortical screw set  Surgeon: Dominica Severin Caylor Tallarico  Assistant: Nash Shearer  Anesthesia: General  EBL: Minimal  HPI: 49 year old gentleman has had previous laminectomies and discectomies at L4-5 and L5-S1 the right patient did initially very well with less of weeks months of progressive worsening back pain and right leg pain work-up revealed progressive degenerative collapse scar tissue spondylosis and possible recurrent disc radiation at L4-5.  Due to patient's progression of clinical syndrome imaging findings and failed conservative treatment I recommended redo decompression and stabilization procedure at L4-5 and L5-S1.  I extensively went over the risks and benefits of that operation with him as well as perioperative course expectations of outcome and alternatives of surgery and he understood and agreed to proceed forward.  Operative procedure: Patient brought into the OR was due to general anesthesia position prone on the Dyer table his back was prepped and draped in routine sterile fashion his old incision was extended slightly cephalad caudally and after infiltration of 10 cc lidocaine with epi and then the scar tissue was dissected free and subperiosteal dissection was carried lamina of L4-L5 and S1 bilaterally.  I also exposed the inferior  aspect lamina of L3 to identify the L4 pedicle entry point after intraoperative x-ray confirmed identification appropriate level I then remove the spinous process at L4 and L5 performed a complete central decompression and first working on the left side performed complete medial medial facetectomies and radical foraminotomies the L4 of L5 and S1 nerve root on the left extensively under bit the superior to collating facet to gain access lateral margin of disc base.  Then working through the scar tissue freed this up confirmed complete facetectomies on the right side marching out foraminotomies of the L4-L5 and S1 nerve roots were carried out there was extensive minor compression of the right L5 nerve root primarily due to severe facet arthropathy but also superior spondylitic ridge on the disc base and disc material.  All after all foraminotomies were performed and I gained access lateral margin disc base disc base was cleaned out bilaterally utilizing sequential distraction I inserted 4 cages sequentially with extensive mount autograft between the cages this significantly opened up the disc base and help reduce the compression opening of the foramina.  Then under fluoroscopy all 6 cortical screws were placed all screws excellent purchase postop fluoroscopy confirmed good position of all the implants.  Wounds and copiously irrigated meticulous hemostasis was maintained Gelfoam was overlaid top of the dura the anchoring knots were then anchored in place the L4 screw was compressed against L5 and the L5 compressed against S1.  After all the rods had been anchored in place and exposed draped in the fascia medium Hemovac drain was placed and the wound was closed in layers with interrupted Vicryl in a running 4 subcuticular Dermabond benzoin Steri-Strips and a sterile dressing was applied patient recovery in stable condition.  At the end the case all needle  count sponge counts were correct.

## 2019-10-27 NOTE — Anesthesia Procedure Notes (Signed)
Procedure Name: Intubation Date/Time: 10/27/2019 7:37 AM Performed by: Lieutenant Diego, CRNA Pre-anesthesia Checklist: Patient identified, Emergency Drugs available, Suction available and Patient being monitored Patient Re-evaluated:Patient Re-evaluated prior to induction Oxygen Delivery Method: Circle system utilized Preoxygenation: Pre-oxygenation with 100% oxygen Induction Type: IV induction Ventilation: Mask ventilation without difficulty Laryngoscope Size: Miller and 2 Grade View: Grade I Tube type: Oral Tube size: 7.5 mm Number of attempts: 1 Airway Equipment and Method: Stylet Placement Confirmation: ETT inserted through vocal cords under direct vision,  positive ETCO2 and breath sounds checked- equal and bilateral Secured at: 24 cm Tube secured with: Tape Dental Injury: Teeth and Oropharynx as per pre-operative assessment

## 2019-10-27 NOTE — H&P (Signed)
Chad Cook is an 49 y.o. male.   Chief Complaint: Back and right greater than left leg pain HPI: 49 year old gentleman with longstanding lumbar spondylosis and stenosis back and bilateral leg pain.  Patient had previous laminotomies at L4-5 and L5-S1 the right presents with recurrent back recurrent right greater than left leg pain with progressive mechanical back pain.  Work-up has revealed progressive degenerative collapse Modic changes and spinal stenosis due to patient progression of clinical syndrome imaging findings and failed conservative treatment of recommended decompression stabilization procedure at L4-5 and L5-S1 I extensively gone over the risks and benefits of the operation with him as well as perioperative course expectations of outcome and alternatives to surgery and he understands and agrees to proceed forward.  Past Medical History:  Diagnosis Date  . Arthritis   . Cancer [facility])    testicular cancer  . Neuromuscular disorder (HCC)    neuropathy  . Sleep apnea    cpap at night    Past Surgical History:  Procedure Laterality Date  . BACK SURGERY    . CERVICAL FUSION  01/27/2019   C5-7- Dr. Saintclair Halsted  . FRACTURE SURGERY Right 2010   Leg  . HEMORRHOID SURGERY  2018  . LUMBAR LAMINECTOMY     Laminectomy and Discectomy x2- With Dr. Saintclair Halsted    History reviewed. No pertinent family history. Social History:  reports that he quit smoking about 30 years ago. He has never used smokeless tobacco. He reports current alcohol use. He reports current drug use. Drug: Marijuana.  Allergies:  Allergies  Allergen Reactions  . Codeine Swelling    Happened as a child pt tolerates oxycodone and hydrocodone    Medications Prior to Admission  Medication Sig Dispense Refill  . acetaminophen (TYLENOL) 500 MG tablet Take 1,000 mg by mouth every 6 (six) hours as needed for moderate pain or headache.    . Ascorbic Acid (VITAMIN C) 1000 MG tablet Take 1,000 mg by mouth daily.    . ciprofloxacin  (CIPRO) 500 MG tablet Take 500 mg by mouth 2 (two) times daily.    . cyclobenzaprine (FLEXERIL) 10 MG tablet Take 10 mg by mouth 3 (three) times daily as needed for muscle spasms.    . diphenhydrAMINE (BENADRYL) 25 MG tablet Take 50 mg by mouth at bedtime as needed for sleep.    . fexofenadine (ALLEGRA) 180 MG tablet Take 180 mg by mouth See admin instructions. Take 180 mg daily for 1 week then off one week, alternating with allegra d    . fexofenadine-pseudoephedrine (ALLEGRA-D 24) 180-240 MG 24 hr tablet Take 1 tablet by mouth See admin instructions. Take 1 tablet daily for 1 week then off one week, alternating with plain allegra    . gabapentin (NEURONTIN) 600 MG tablet Take 600 mg by mouth 2 (two) times daily.    . vitamin B-12 (CYANOCOBALAMIN) 500 MCG tablet Take 500 mcg by mouth daily.    . Vitamin D, Ergocalciferol, (DRISDOL) 1.25 MG (50000 UNIT) CAPS capsule Take 50,000 Units by mouth every Monday.    . zolpidem (AMBIEN) 10 MG tablet Take 5 mg by mouth at bedtime.     Marland Kitchen olopatadine (PATADAY) 0.1 % ophthalmic solution Place 1 drop into both eyes 2 (two) times daily as needed for allergies.      No results found for this or any previous visit (from the past 48 hour(s)). No results found.  Review of Systems  Neurological: Positive for weakness and numbness.    Blood  pressure (!) 154/77, pulse 82, temperature 98.5 F (36.9 C), temperature source Oral, resp. rate 18, height 5\' 9"  (1.753 m), weight (!) 113.4 kg, SpO2 97 %. Physical Exam HENT:     Head: Normocephalic.     Nose: Nose normal.  Eyes:     Pupils: Pupils are equal, round, and reactive to light.  Cardiovascular:     Rate and Rhythm: Normal rate.     Pulses: Normal pulses.  Pulmonary:     Effort: Pulmonary effort is normal.  Abdominal:     General: Abdomen is flat. Bowel sounds are normal.  Musculoskeletal:        General: Normal range of motion.     Cervical back: Normal range of motion.  Skin:    General: Skin is  warm.  Neurological:     Mental Status: He is alert.     Comments: Patient is awake and alert strength is 5-5 iliopsoas, quads, hamstrings, gastrocs into tibialis and EHL on the left is 5 out of 5 on the right he has dorsiflexion weakness and EHL weakness partially because of old ankle surgery and a fused ankle.      Assessment/Plan 49 year old presents for decompression stabilization procedure L4-5 L5-S1  Elaina Hoops, MD 10/27/2019, 7:14 AM

## 2019-10-28 MED ORDER — OXYCODONE HCL 10 MG PO TABS: 10.0000 mg | ORAL_TABLET | ORAL | 0 refills | Status: AC | PRN

## 2019-10-28 NOTE — Discharge Summary (Signed)
Physician Discharge Summary  Patient ID: Chad Cook MRN: [id number] DOB/AGE: 09-29-1970 49 y.o. Estimated body mass index is 36.92 kg/m as calculated from the following:   Height as of this encounter: 5\' 9"  (1.753 m).   Weight as of this encounter: 113.4 kg.   Admit date: 10/27/2019 Discharge date: 10/28/2019  Admission Diagnoses: Lumbar degenerative disc disease spinal stenosis L4-5 L5-S1  Discharge Diagnoses: Same Active Problems:   Spinal stenosis at L4-L5 level   Discharged Condition: fair  Hospital Course: Patient was admitted to hospital underwent decompression stabilization procedure L4-5 L5-S1 was ambulating and voiding tolerating her diet working with physical therapy and stable for discharge home postop day 1.  Patient be discharged scheduled follow-up in 1 to 2 weeks.  Consults: Significant Diagnostic Studies: Treatments: Strength out of 5 wound clean dry and intact Discharge Exam: Blood pressure (!) 147/69, pulse 73, temperature 98.2 F (36.8 C), temperature source Oral, resp. rate 20, height 5\' 9"  (1.753 m), weight (!) 113.4 kg, SpO2 98 %. Strength 5 of 5 wound clean dry and intact  Disposition: Home   Allergies as of 10/28/2019      Reactions   Codeine Swelling   Happened as a child pt tolerates oxycodone and hydrocodone      Medication List    TAKE these medications   acetaminophen 500 MG tablet Commonly known as: TYLENOL Take 1,000 mg by mouth every 6 (six) hours as needed for moderate pain or headache.   ciprofloxacin 500 MG tablet Commonly known as: CIPRO Take 500 mg by mouth 2 (two) times daily.   cyclobenzaprine 10 MG tablet Commonly known as: FLEXERIL Take 10 mg by mouth 3 (three) times daily as needed for muscle spasms.   diphenhydrAMINE 25 MG tablet Commonly known as: BENADRYL Take 50 mg by mouth at bedtime as needed for sleep.   fexofenadine 180 MG tablet Commonly known as: ALLEGRA Take 180 mg by mouth See admin instructions. Take  180 mg daily for 1 week then off one week, alternating with allegra d   fexofenadine-pseudoephedrine 180-240 MG 24 hr tablet Commonly known as: ALLEGRA-D 24 Take 1 tablet by mouth See admin instructions. Take 1 tablet daily for 1 week then off one week, alternating with plain allegra   gabapentin 600 MG tablet Commonly known as: NEURONTIN Take 600 mg by mouth 2 (two) times daily.   Oxycodone HCl 10 MG Tabs Take 1 tablet (10 mg total) by mouth every 3 (three) hours as needed for severe pain ((score 7 to 10)).   Pataday 0.1 % ophthalmic solution Generic drug: olopatadine Place 1 drop into both eyes 2 (two) times daily as needed for allergies.   vitamin B-12 500 MCG tablet Commonly known as: CYANOCOBALAMIN Take 500 mcg by mouth daily.   vitamin C 1000 MG tablet Take 1,000 mg by mouth daily.   Vitamin D (Ergocalciferol) 1.25 MG (50000 UNIT) Caps capsule Commonly known as: DRISDOL Take 50,000 Units by mouth every Monday.   zolpidem 10 MG tablet Commonly known as: AMBIEN Take 5 mg by mouth at bedtime.        Signed: Elaina Hoops 10/28/2019, 7:40 AM

## 2019-10-28 NOTE — Plan of Care (Signed)
Pt doing well. Pt and wife given D/C instructions with verbal understanding. Rx was sent to the pharmacy by MD. Pt's incision is clean and dry with no sign of infection. Pt's IV and Hemovac were removed prior to D/C. Pt D/C'd home via wheelchair per MD order. Pt is stable @ D/C and has no other needs at this time. Holli Humbles, RN

## 2019-10-28 NOTE — Evaluation (Signed)
Physical Therapy Evaluation Patient Details Name: Chad Cook MRN: [id number] DOB: [birth year] Today's Date: 10/28/2019   History of Present Illness  Patient is a 49 y/o male who presents s/p L4-5, L5-S1 PLIF 7/26. PMH includes testicular ca, sleep apnea on CPAP.  Clinical Impression  Patient presents with pain and post surgical deficits s/p above surgery. 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 Lives with wife and children. Today, pt tolerated bed mobility, transfers, gait training and stair training Mod I-supervision for safety. Education re: back precautions, positioning, log roll technique, walking program, brace etc. Pt does not require skilled therapy services as pt functioning at Mod I level. All education completed. Discharge from therapy.    Follow Up Recommendations No PT follow up    Equipment Recommendations  None recommended by PT    Recommendations for Other Services       Precautions / Restrictions Precautions Precautions: Back Precaution Booklet Issued: Yes (comment) Precaution Comments: Reviewed handout and precautions; hemovac Required Braces or Orthoses: Spinal Brace Spinal Brace: Lumbar corset;Applied in standing position Restrictions Weight Bearing Restrictions: No      Mobility  Bed Mobility Overal bed mobility: Modified Independent             General bed mobility comments: HOB flat, no use of rails to simulate home.  Transfers Overall transfer level: Modified independent Equipment used: None             General transfer comment: Stood from EOB without difficulty.  Ambulation/Gait Ambulation/Gait assistance: Modified independent (Device/Increase time) Gait Distance (Feet): 400 Feet Assistive device: None Gait Pattern/deviations: Step-through pattern;Decreased stride length Gait velocity: good speed Gait velocity interpretation: 1.31 - 2.62 ft/sec, indicative of limited community ambulator General Gait Details: Steady gait  without evidence of imbalance.  Stairs Stairs: Yes Stairs assistance: Supervision Stair Management: One rail Left Number of Stairs: 13 General stair comments: Cues for technique/safety.  Wheelchair Mobility    Modified Rankin (Stroke Patients Only)       Balance Overall balance assessment: No apparent balance deficits (not formally assessed)                                           Pertinent Vitals/Pain Pain Assessment: Faces Faces Pain Scale: Hurts a little bit Pain Location: bil thighs Pain Descriptors / Indicators: Aching Pain Intervention(s): Monitored during session;Repositioned;Premedicated before session    Home Living Family/patient expects to be discharged to:: Private residence Living Arrangements: Spouse/significant other;Children Available Help at Discharge: Family;Available 24 hours/day Type of Home: House Home Access: Level entry     Home Layout: Multi-level Home Equipment: Cane - single point;Crutches      Prior Function Level of Independence: Independent         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 Independent for ADLs/IADls, drives.     Hand Dominance        Extremity/Trunk Assessment   Upper Extremity Assessment Upper Extremity Assessment: Defer to OT evaluation    Lower Extremity Assessment Lower Extremity Assessment: RLE deficits/detail;LLE deficits/detail RLE Deficits / Details: tingling in foot; decreased ankle AROM in DF due to prior surgery LLE Deficits / Details: tingling in foot    Cervical / Trunk Assessment Cervical / Trunk Assessment: Other exceptions Cervical / Trunk Exceptions: s/p back surgery  Communication   Communication: No difficulties  Cognition Arousal/Alertness: Awake/alert Behavior During Therapy: WFL for tasks assessed/performed  Overall Cognitive Status: Within Functional Limits for tasks assessed                                        General Comments       Exercises     Assessment/Plan    PT Assessment Patent does not need any further PT services  PT Problem List         PT Treatment Interventions      PT Goals (Current goals can be found in the Care Plan section)  Acute Rehab PT Goals Patient Stated Goal: to get back to independence PT Goal Formulation: All assessment and education complete, DC therapy    Frequency     Barriers to discharge        Co-evaluation               AM-PAC PT "6 Clicks" Mobility  Outcome Measure Help needed turning from your back to your side while in a flat bed without using bedrails?: None Help needed moving from lying on your back to sitting on the side of a flat bed without using bedrails?: None Help needed moving to and from a bed to a chair (including a wheelchair)?: None Help needed standing up from a chair using your arms (e.g., wheelchair or bedside chair)?: None Help needed to walk in hospital room?: None Help needed climbing 3-5 steps with a railing? : A Little 6 Click Score: 23    End of Session Equipment Utilized During Treatment: Back brace Activity Tolerance: Patient tolerated treatment well Patient left: in bed;with call bell/phone within reach Nurse Communication: Mobility status PT Visit Diagnosis: Pain Pain - Right/Left:  (bil) Pain - part of body:  (thighs)    Time: 7867-6720 PT Time Calculation (min) (ACUTE ONLY): 25 min   Charges:   PT Evaluation $PT Eval Low Complexity: 1 Low PT Treatments $Gait Training: 8-22 mins        Chad Cook, PT, DPT Acute Rehabilitation Services Pager [phone] Office [phone]      Chad Cook 10/28/2019, 9:10 AM

## 2019-10-28 NOTE — Progress Notes (Signed)
Occupational Therapy Evaluation  Completed education regarding back precautionsfor ADL, IADL and functional mobility for ADL. Handout reviewed. Wife present for education. Pt/wife verbalized understanding. No further OT needed.     10/28/19 0910  OT Visit Information  Last OT Received On 10/28/19  Assistance Needed +1  History of Present Illness Patient is a 49 y/o male who presents s/p L4-5, L5-S1 PLIF 7/26. PMH includes testicular ca, sleep apnea on CPAP.  Precautions  Precautions Back  Precaution Booklet Issued Yes (comment)  Required Braces or Orthoses Spinal Brace  Spinal Brace Lumbar corset;Applied in standing position  Home Living  Family/patient expects to be discharged to: Private residence  Living Arrangements Spouse/significant other;Children  Available Help at Discharge Family;Available 24 hours/day  Type of Home House  Home Access Level entry  Home Layout Multi-level  Alternate Level Stairs-Number of Steps 1 flight to each level  Alternate Level Stairs-Rails Right  Bathroom Shower/Tub Walk-in Cytogeneticist Yes  How Accessible Accessible via walker  [place] - single point;Crutches  Prior Function  Level of Independence Independent  Comments Works in [employer] from home. Independent for ADLs/IADls, drives.  Pain Assessment  Pain Assessment 0-10  Pain Score 5  Pain Location  (back)  Pain Descriptors / Indicators Aching  Pain Intervention(s) Limited activity within patient's tolerance;Premedicated before session  Cognition  Arousal/Alertness Awake/alert  Behavior During Therapy WFL for tasks assessed/performed  Overall Cognitive Status Within Functional Limits for tasks assessed  Upper Extremity Assessment  Upper Extremity Assessment Overall WFL for tasks assessed  Lower Extremity Assessment  Lower Extremity Assessment Defer to PT evaluation (complains of B thigh weakness)  Cervical / Trunk Assessment  Cervical  / Trunk Assessment Other exceptions  Cervical / Trunk Exceptions s/p back surgery (hx of multiple back surgeries)  ADL  Overall ADL's  Needs assistance/impaired  Functional mobility during ADLs Modified independent  General ADL Comments Able to complete figure four positioning for ADL tasks; Educated on use of reacher for retrieving items form floor. Pt states he has a dog and a kid who can pick something up if needed. Educated on back precautions during bathing/dressing/grooming adn toileting. Pt verbalzied understanding. Wife present adn verbalized understanding. Wife states she can assist as needed. REviewed use of tongs if pt has difficulty with pericare.   Bed Mobility  Overal bed mobility Modified Independent  Transfers  Overall transfer level Modified independent  Balance  Overall balance assessment No apparent balance deficits (not formally assessed)  OT - End of Session  Equipment Utilized During Treatment Back brace  Activity Tolerance Patient tolerated treatment well  Patient left in bed;with call bell/phone within reach;with family/visitor present  Nurse Communication Other (comment) (pt ready for DC)  OT Assessment  OT Recommendation/Assessment Patient does not need any further OT services  OT Visit Diagnosis Muscle weakness (generalized) (M62.81);Pain  OT Problem List Decreased strength;Decreased knowledge of use of DME or AE;Decreased knowledge of precautions  AM-PAC OT "6 Clicks" Daily Activity Outcome Measure (Version 2)  Help from another person eating meals? 4  Help from another person taking care of personal grooming? 4  Help from another person toileting, which includes using toliet, bedpan, or urinal? 3  Help from another person bathing (including washing, rinsing, drying)? 3  Help from another person to put on and taking off regular upper body clothing? 4  Help from another person to put on and taking off regular lower body clothing? 3  6 Click Score 21  OT  Recommendation  Follow Up Recommendations No OT follow up;Supervision - Intermittent  OT Equipment None recommended by OT  Acute Rehab OT Goals  Patient Stated Goal to get back to independence  OT Goal Formulation All assessment and education complete, DC therapy  OT Time Calculation  OT Start Time (ACUTE ONLY) 0931  OT Stop Time (ACUTE ONLY) 0949  OT Time Calculation (min) 18 min  OT General Charges  $OT Visit 1 Visit  OT Evaluation  $OT Eval Low Complexity 1 Low  Written Expression  Dominant Hand Right  Maurie Boettcher, OT/L   Acute OT Clinical Specialist Acute Rehabilitation Services Pager [phone] Office [phone]

## 2019-10-28 NOTE — Discharge Instructions (Signed)

## 2019-10-30 MED FILL — Sodium Chloride IV Soln 0.9%: INTRAVENOUS | Qty: 1000 | Status: AC

## 2019-10-30 MED FILL — Heparin Sodium (Porcine) Inj 1000 Unit/ML: INTRAMUSCULAR | Qty: 30 | Status: AC

## 2020-08-28 IMAGING — RF DG C-ARM 1-60 MIN
1 series · 2 of 2 positions shown · non-contrast
Comparison: None.

CLINICAL DATA: Surgical posterior fusion of L4-5 and L5-S1.

EXAM:
LUMBAR SPINE - 2-3 VIEW; DG C-ARM 1-60 MIN
Radiation exposure index: 45.96 mGy.

[Series 1: run · 2 of 2 slices shown]
[im 1/2]
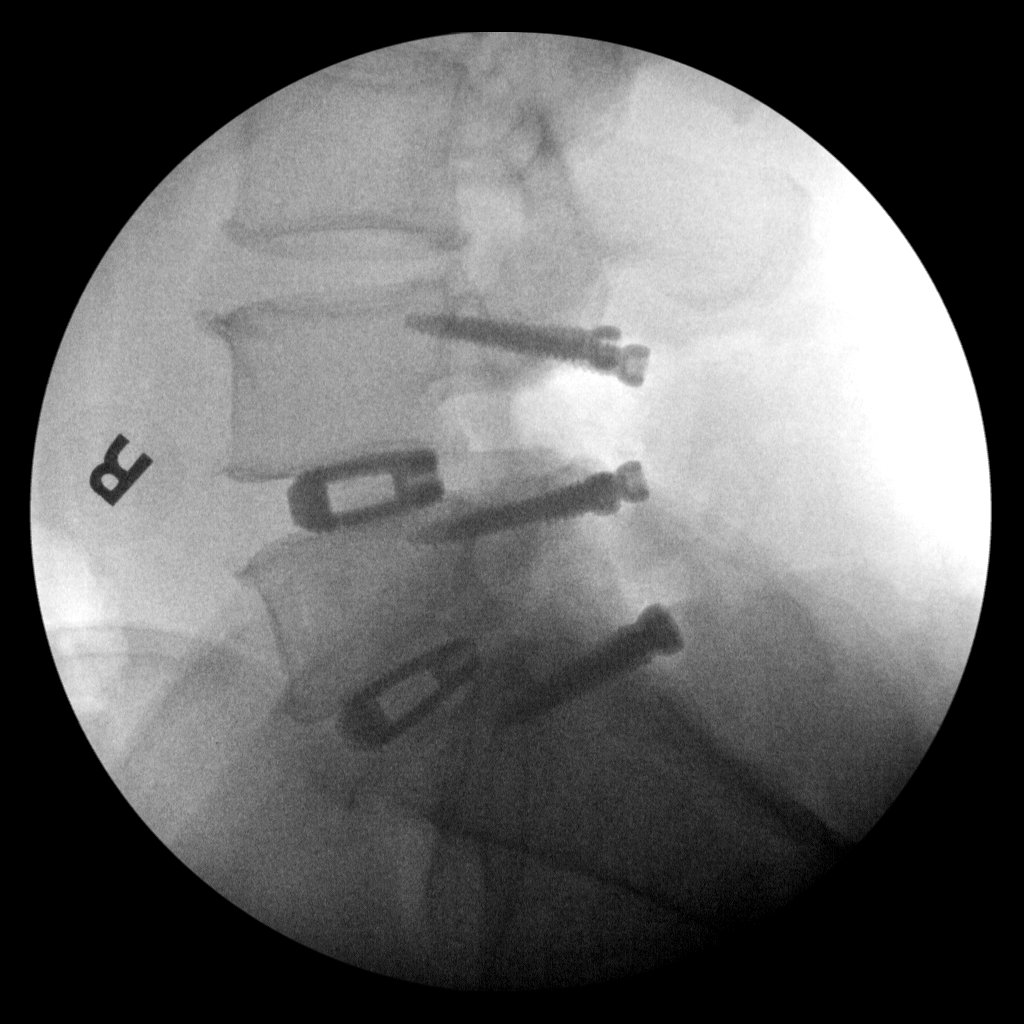
[im 2/2]
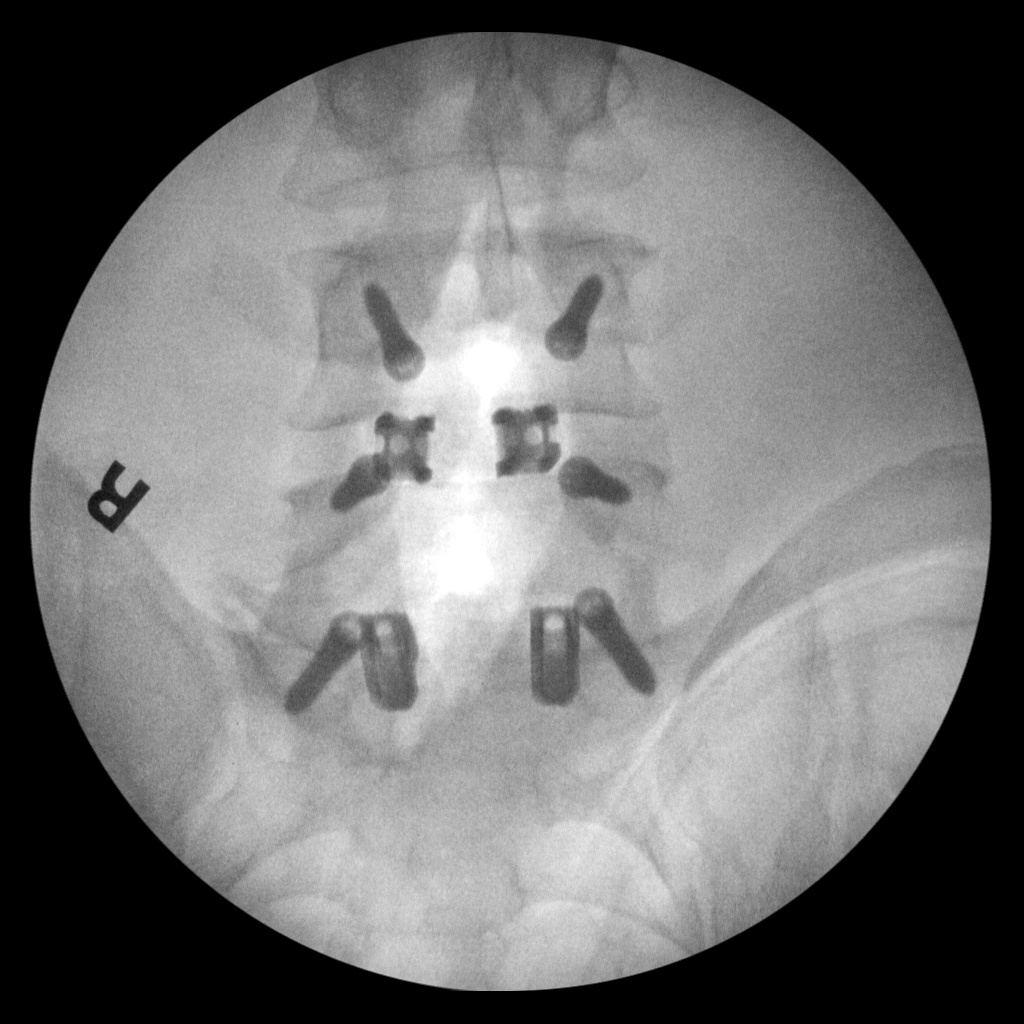

[2 of 2 positions shown; findings below may reference images not displayed]

FINDINGS: Two intraoperative fluoroscopic images were obtained of the lower
lumbar spine. These images demonstrate patient be status post
surgical posterior fusion of L4-5 and L5-S1 with bilateral
intrapedicular screw placement and interbody fusion.
IMPRESSION: Fluoroscopic guidance provided during surgical posterior fusion of
L4-5 and L5-S1.
